# Patient Record
Sex: Male | Born: 1980 | Race: White | Hispanic: No | Marital: Married | Smoking: Current every day smoker
Health system: Southern US, Community
[De-identification: ages and names within clinical notes are randomized; demographics above are authoritative.]

---

## 2008-06-19 ENCOUNTER — Emergency Department: Payer: Self-pay | Admitting: Internal Medicine

## 2017-11-06 ENCOUNTER — Ambulatory Visit
Admission: EM | Admit: 2017-11-06 | Discharge: 2017-11-06 | Disposition: A | Discharge status: Home / Self Care | Payer: Self-pay | Attending: Emergency Medicine | Admitting: Emergency Medicine

## 2017-11-06 ENCOUNTER — Ambulatory Visit: Payer: Self-pay

## 2017-11-06 ENCOUNTER — Encounter: Payer: Self-pay | Admitting: Emergency Medicine

## 2017-11-06 ENCOUNTER — Ambulatory Visit (INDEPENDENT_AMBULATORY_CARE_PROVIDER_SITE_OTHER): Payer: Self-pay

## 2017-11-06 ENCOUNTER — Other Ambulatory Visit: Payer: Self-pay

## 2017-11-06 DIAGNOSIS — M25461 Effusion, right knee: Secondary | ICD-10-CM

## 2017-11-06 DIAGNOSIS — M25561 Pain in right knee: Secondary | ICD-10-CM

## 2017-11-06 MED ORDER — NAPROXEN 500 MG PO TABS: 500.0000 mg | ORAL_TABLET | Freq: Two times a day (BID) | ORAL | 0 refills | Status: AC

## 2017-11-06 NOTE — Discharge Instructions (Addendum)
Keep elevated  Take NSAIDS Will need to see ortho for further tx and possible MRI  Wear knee immobilizer to help with support

## 2017-11-06 NOTE — ED Provider Notes (Signed)
[facility]    CSN: [id number] Arrival date & time: 11/06/17  1016     History   Chief Complaint Chief Complaint  Patient presents with  . Knee Pain    right    HPI Jonathan Odonnell is a 37 y.o. male.   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 States that he has been resting it at night and this has helped some, but it is getting worse. Unknown of any injury states that he is getting up and down frequently with his job. Has taken NSAIDS which has helped some. No erythema, no sob, no calf pain. Denies any vascular issues to leg.       History reviewed. No pertinent past medical history.  There are no active problems to display for this patient.   History reviewed. No pertinent surgical history.     Home Medications    Prior to Admission medications   Medication Sig Start Date End Date Taking? Authorizing Provider  naproxen (NAPROSYN) 500 MG tablet Take 1 tablet (500 mg total) by mouth 2 (two) times daily. 11/06/17   Coralyn MarkMitchell, Karielle Davidow L, NP    Family History History reviewed. No pertinent family history.  Social History Social History   Tobacco Use  . Smoking status: Current Every Day Smoker    Types: Cigarettes  . Smokeless tobacco: Never Used  Substance Use Topics  . Alcohol use: Yes  . Drug use: Not on file     Allergies   Patient has no known allergies.   Review of Systems Review of Systems  Respiratory: Negative.   Cardiovascular: Negative.   Musculoskeletal: Positive for joint swelling.       Rt knee pain and swelling down to ankle   Skin:       Edema to posterior knee, and ankle   Neurological: Negative.      Physical Exam Triage Vital Signs ED Triage Vitals  Enc Vitals Group     BP 11/06/17 1041 135/82     Pulse Rate 11/06/17 1041 86     Resp 11/06/17 1041 16     Temp 11/06/17 1041 98.3 F (36.8 C)     Temp Source  11/06/17 1041 Oral     SpO2 11/06/17 1041 100 %     Weight 11/06/17 1038 190 lb (86.2 kg)     Height 11/06/17 1038 6\' 2"  (1.88 m)     Head Circumference --      Peak Flow --      Pain Score 11/06/17 1038 3     Pain Loc --      Pain Edu? --      Excl. in GC? --    No data found.  Updated Vital Signs BP 135/82 (BP Location: Right Arm)   Pulse 86   Temp 98.3 F (36.8 C) (Oral)   Resp 16   Ht 6\' 2"  (1.88 m)   Wt 190 lb (86.2 kg)   SpO2 100%   BMI 24.39 kg/m   Visual Acuity     Physical Exam  Constitutional: He appears well-developed.  Cardiovascular: Normal rate and regular rhythm.  Pulmonary/Chest: Effort normal and breath sounds normal.  Musculoskeletal: He exhibits edema and tenderness.  +2 edema posterior to knee and to ankle area, -homans, full ROM with tenderness upon palpation of posterior. Strong pulses, warm to touch,   Neurological: He  is alert.  Skin: Skin is warm. Capillary refill takes less than 2 seconds.     UC Treatments / Results  Labs (all labs ordered are listed, but only abnormal results are displayed) Labs Reviewed - No data to display  EKG  EKG Interpretation None       Radiology Dg Knee Complete 4 Views Right  Result Date: 11/06/2017 CLINICAL DATA:  Right knee pain/swelling, no known injury EXAM: RIGHT KNEE - COMPLETE 4+ VIEW COMPARISON:  None. FINDINGS: No fracture or dislocation is seen. The joint spaces are preserved. Moderate suprapatellar knee joint effusion. IMPRESSION: Moderate suprapatellar knee joint effusion. Electronically Signed   By: Charline Bills M.D.   On: 11/06/2017 11:34    Procedures Procedures (including critical care time)  Medications Ordered in UC Medications - No data to display   Initial Impression / Assessment and Plan / UC Course  I have reviewed the triage vital signs and the nursing notes.  Pertinent labs & imaging results that were available during my care of the patient were reviewed by me and  considered in my medical decision making (see chart for details).     Keep elevated  Take NSAIDS Will need to see ortho for further tx and possible MRI  Wear knee immobilizer to help with support   Final Clinical Impressions(s) / UC Diagnoses   Final diagnoses:  Acute pain of right knee  Effusion of right knee    ED Discharge Orders        Ordered    naproxen (NAPROSYN) 500 MG tablet  2 times daily     11/06/17 1110       Controlled Substance Prescriptions [place] Controlled Substance Registry consulted? Not Applicable   Coralyn Mark, NP 11/06/17 1152

## 2017-11-06 NOTE — ED Triage Notes (Signed)
Patient c/o right knee pain and swelling that started make in December.  Patient denies injury or fall.

## 2018-10-06 IMAGING — CR DG KNEE COMPLETE 4+V*R*
4 series · 4 of 4 positions shown · non-contrast
Comparison: None.

CLINICAL DATA: Right knee pain/swelling, no known injury

EXAM:
RIGHT KNEE - COMPLETE 4+ VIEW

[knee ap (1 of 3)]
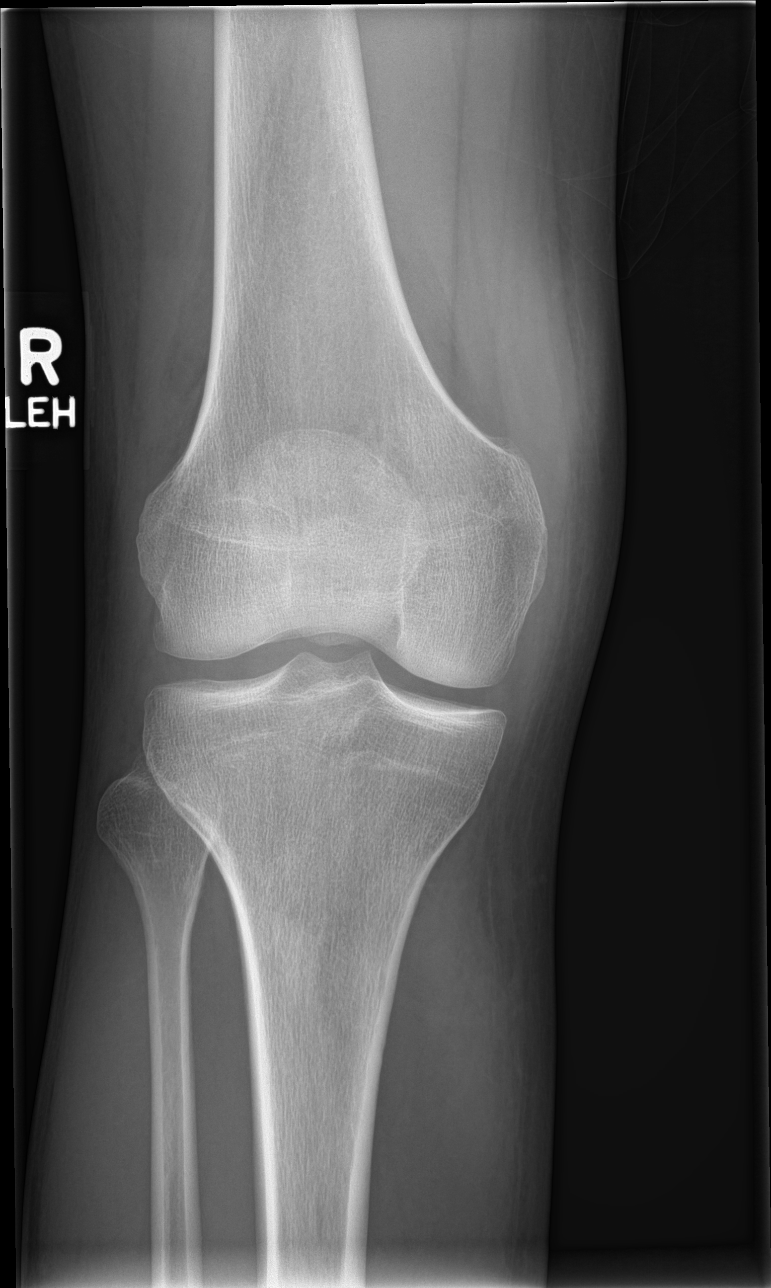

[knee lat]
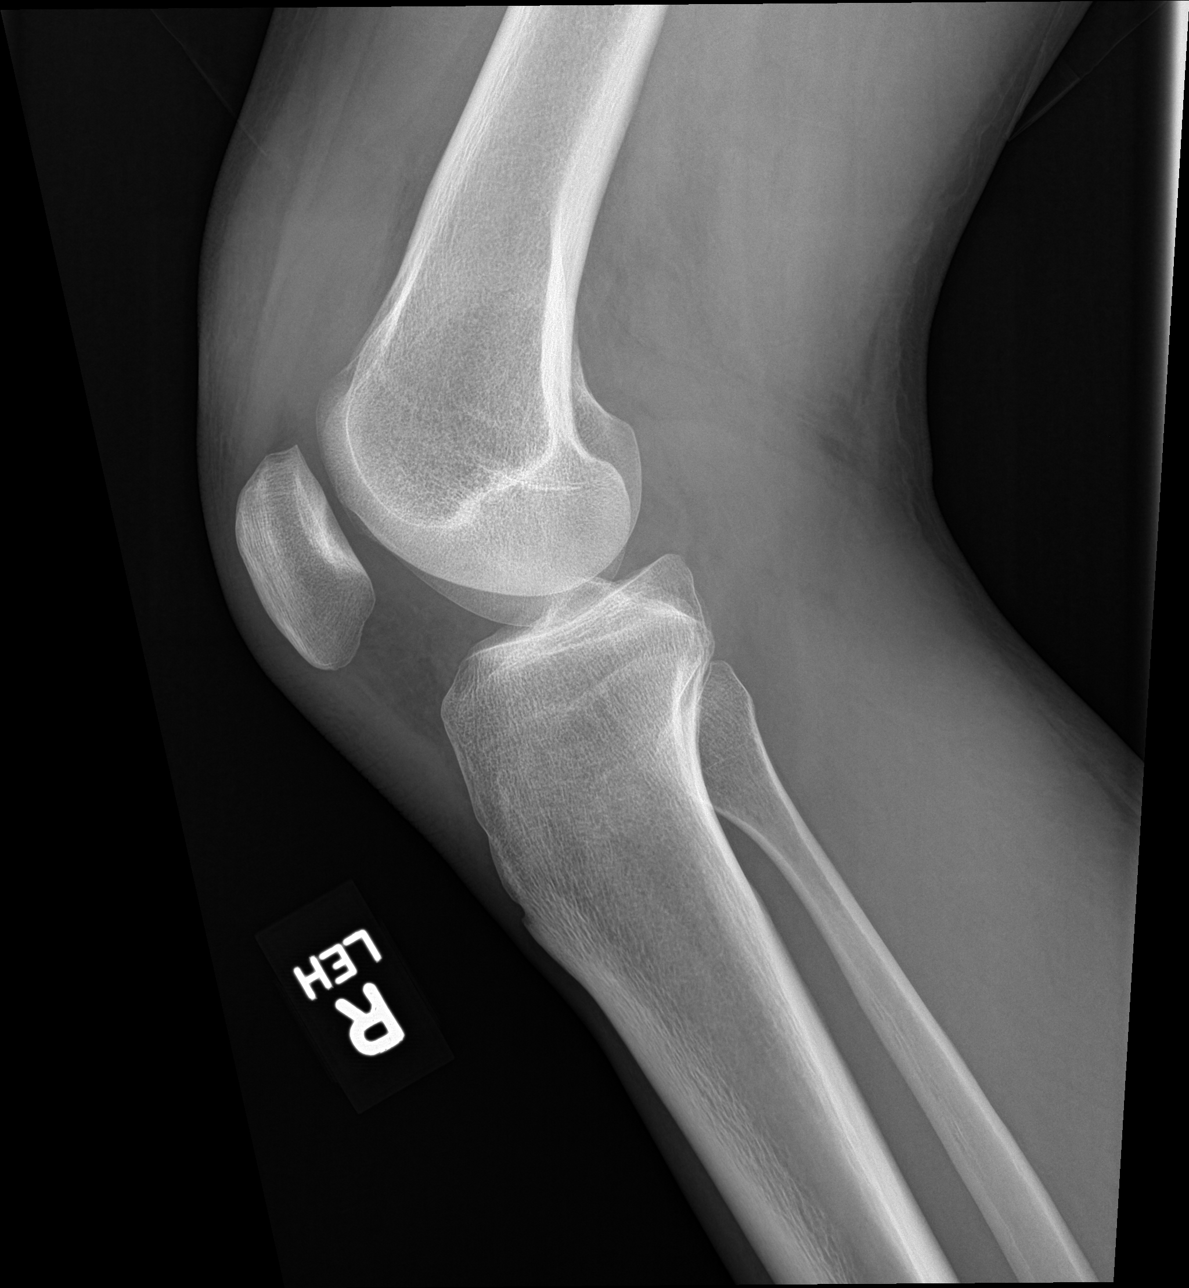

[knee ap (2 of 3)]
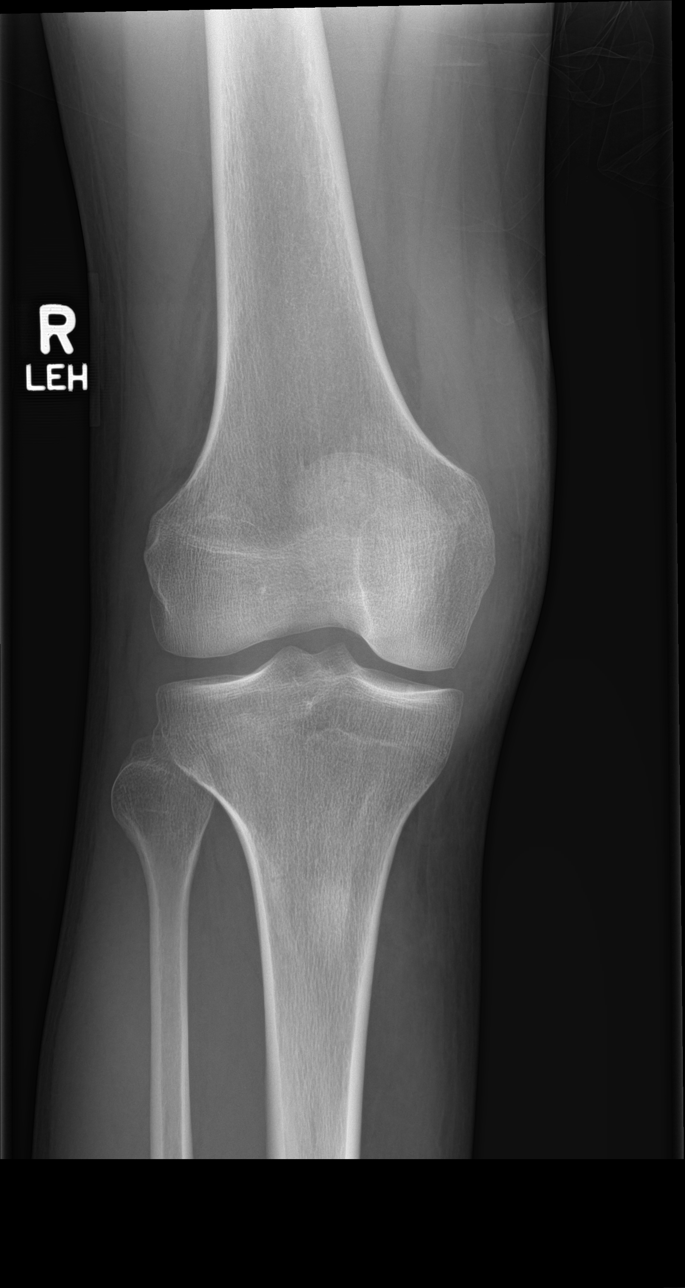

[knee ap (3 of 3)]
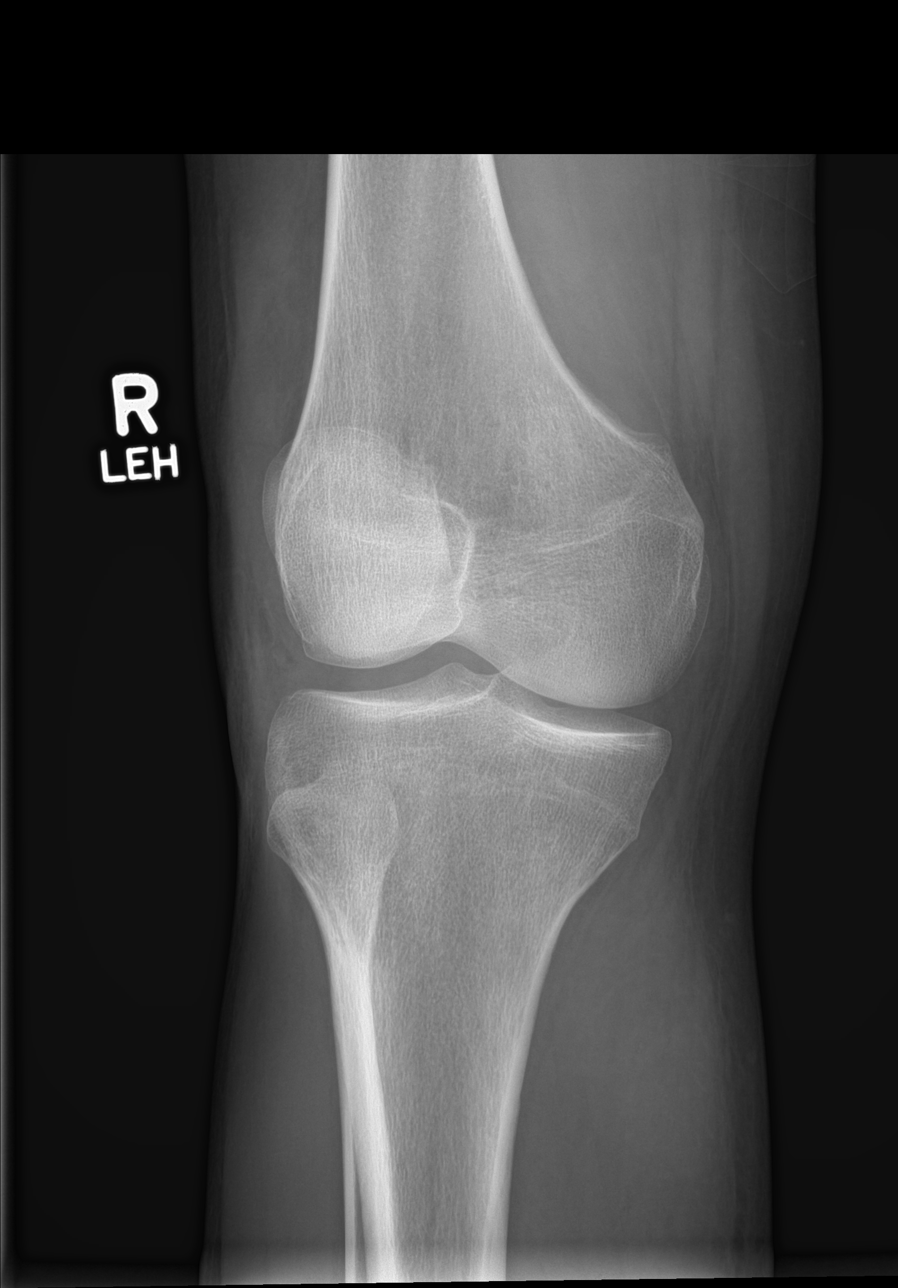

[4 of 4 positions shown; findings below may reference images not displayed]

FINDINGS: No fracture or dislocation is seen.

The joint spaces are preserved.

Moderate suprapatellar knee joint effusion.
IMPRESSION: Moderate suprapatellar knee joint effusion.
